# Patient Record
Sex: Male | Born: 2000 | Hispanic: No | Marital: Single
Health system: Southern US, Community
[De-identification: ages and names within clinical notes are randomized; demographics above are authoritative.]

---
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
## 2018-03-23 ENCOUNTER — Encounter: Payer: Self-pay | Admitting: Podiatry

## 2018-03-23 ENCOUNTER — Ambulatory Visit: Payer: BLUE CROSS/BLUE SHIELD | Admitting: Podiatry

## 2018-03-23 ENCOUNTER — Ambulatory Visit (INDEPENDENT_AMBULATORY_CARE_PROVIDER_SITE_OTHER): Payer: BLUE CROSS/BLUE SHIELD

## 2018-03-23 DIAGNOSIS — H5502 Latent nystagmus: Secondary | ICD-10-CM | POA: Insufficient documentation

## 2018-03-23 DIAGNOSIS — H53009 Unspecified amblyopia, unspecified eye: Secondary | ICD-10-CM | POA: Insufficient documentation

## 2018-03-23 DIAGNOSIS — M2011 Hallux valgus (acquired), right foot: Secondary | ICD-10-CM

## 2018-03-23 DIAGNOSIS — H47399 Other disorders of optic disc, unspecified eye: Secondary | ICD-10-CM | POA: Insufficient documentation

## 2018-03-23 DIAGNOSIS — H50111 Monocular exotropia, right eye: Secondary | ICD-10-CM | POA: Insufficient documentation

## 2018-03-23 NOTE — Progress Notes (Signed)
  Subjective:  Patient ID: Alec Perez, male    DOB: 10/08/2001,  MRN: [id number] HPI Chief Complaint  Patient presents with  . 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 New Patient (Initial Visit)    17 y.o. male presents with the above complaint.   ROS: He denies fever chills nausea vomiting muscle aches pains calf pain back pain chest pain shortness of breath.  No past medical history on file.   Current Outpatient Medications:  .  dexmethylphenidate (FOCALIN XR) 15 MG 24 hr capsule, , Disp: , Rfl:  .  EPINEPHrine (EPIPEN JR) 0.15 MG/0.3ML injection, , Disp: , Rfl:   Allergies  Allergen Reactions  . Bee Pollen Anaphylaxis   Review of Systems Objective:  There were no vitals filed for this visit.  General: Well developed, nourished, in no acute distress, alert and oriented x3   Dermatological: Skin is warm, dry and supple bilateral. Nails x 10 are well maintained; remaining integument appears unremarkable at this time. There are no open sores, no preulcerative lesions, no rash or signs of infection present.  Vascular: Dorsalis Pedis artery and Posterior Tibial artery pedal pulses are 2/4 bilateral with immedate capillary fill time. Pedal hair growth present. No varicosities and no lower extremity edema present bilateral.   Neruologic: Grossly intact via light touch bilateral. Vibratory intact via tuning fork bilateral. Protective threshold with Semmes Wienstein monofilament intact to all pedal sites bilateral. Patellar and Achilles deep tendon reflexes 2+ bilateral. No Babinski or clonus noted bilateral.   Musculoskeletal: No gross boney pedal deformities bilateral. No pain, crepitus, or limitation noted with foot and ankle range of motion bilateral. Muscular strength 5/5 in all groups tested bilateral.  Gait: Unassisted, Nonantalgic.    Radiographs:  Radiographs of the right foot taken today demonstrate an  osseously mature individual increase in the first intermetatarsal angle resulting in an increase in the hallux abductus angle.  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  Hypertrophic medial condyle is present.  No other osseous abnormalities.  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    Plan: Discussed etiology pathology conservative surgical therapy at this point in time performed a surgical consult consisting of an [facility] bunion repair with screw fixation right foot.  He understands this is amenable to it we did discuss the possible postop complications which may include but are not limited to postop pain bleeding swelling infection recurrence need further surgery overcorrection under correction loss of digit loss of limb loss of life.  He understands this is amenable to it dispensed a Cam walker today signed all 3 pages a consent form dispensed information regarding his preop surgery expectations as well as surgical center as and anesthesia information.     Max T. BristolHyatt, [place] Dakota[place]PM

## 2018-03-23 NOTE — Patient Instructions (Signed)
Pre-Operative Instructions  Congratulations, you have decided to take an important step towards improving your quality of life.  You can be assured that the doctors and staff at [facility] will be with you every step of the way.  Here are some important things you should know:  1. Plan to be at the surgery center/hospital at least 1 (one) hour prior to your scheduled time, unless otherwise directed by the surgical center/hospital staff.  You must have a responsible adult accompany you, remain during the surgery and drive you home.  Make sure you have directions to the surgical center/hospital to ensure you arrive on time. 2. If you are having surgery at [facility] or [facility], you will need a copy of your medical history and physical form from your family physician within one month prior to the date of surgery. We will give you a form for your primary physician to complete.  3. We make every effort to accommodate the date you request for surgery.  However, there are times where surgery dates or times have to be moved.  We will contact you as soon as possible if a change in schedule is required.   4. No aspirin/ibuprofen for one week before surgery.  If you are on aspirin, any non-steroidal anti-inflammatory medications (Mobic, Aleve, Ibuprofen) should not be taken seven (7) days prior to your surgery.  You make take Tylenol for pain prior to surgery.  5. Medications - If you are taking daily heart and blood pressure medications, seizure, reflux, allergy, asthma, anxiety, pain or diabetes medications, make sure you notify the surgery center/hospital before the day of surgery so they can tell you which medications you should take or avoid the day of surgery. 6. No food or drink after midnight the night before surgery unless directed otherwise by surgical center/hospital staff. 7. No alcoholic beverages 24-hours prior to surgery.  No smoking 24-hours prior or 24-hours after  surgery. 8. Wear loose pants or shorts. They should be loose enough to fit over bandages, boots, and casts. 9. Don't wear slip-on shoes. Sneakers are preferred. 10. Bring your boot with you to the surgery center/hospital.  Also bring crutches or a walker if your physician has prescribed it for you.  If you do not have this equipment, it will be provided for you after surgery. 11. If you have not been contacted by the surgery center/hospital by the day before your surgery, call to confirm the date and time of your surgery. 12. Leave-time from work may vary depending on the type of surgery you have.  Appropriate arrangements should be made prior to surgery with your employer. 13. Prescriptions will be provided immediately following surgery by your doctor.  Fill these as soon as possible after surgery and take the medication as directed. Pain medications will not be refilled on weekends and must be approved by the doctor. 14. Remove nail polish on the operative foot and avoid getting pedicures prior to surgery. 15. Wash the night before surgery.  The night before surgery wash the foot and leg well with water and the antibacterial soap provided. Be sure to pay special attention to beneath the toenails and in between the toes.  Wash for at least three (3) minutes. Rinse thoroughly with water and dry well with a towel.  Perform this wash unless told not to do so by your physician.  Enclosed: 1 Ice pack (please put in freezer the night before surgery)   1 Hibiclens skin cleaner     Pre-op instructions  If you have any questions regarding the instructions, please do not hesitate to call our office.  [place]: [address], [place], [place] [address] -- [phone]  [place]: [address] [place], [place] [address] -- [phone]  [place]: [address]  [place], [place] [address] -- [phone]  [place]: [address], [address], [place], [place] [address] -- [phone]  Website:  [email or web address] 

## 2018-05-05 ENCOUNTER — Other Ambulatory Visit: Payer: Self-pay | Admitting: Podiatry

## 2018-05-05 ENCOUNTER — Telehealth: Payer: Self-pay | Admitting: *Deleted

## 2018-05-05 MED ORDER — ONDANSETRON HCL 4 MG PO TABS: 4.0000 mg | ORAL_TABLET | Freq: Three times a day (TID) | ORAL | 0 refills | Status: DC | PRN

## 2018-05-05 MED ORDER — CEPHALEXIN 500 MG PO CAPS
500.0000 mg | ORAL_CAPSULE | Freq: Three times a day (TID) | ORAL | 0 refills | Status: DC
Start: 2018-05-05 — End: 2018-05-23

## 2018-05-05 MED ORDER — OXYCODONE-ACETAMINOPHEN 10-325 MG PO TABS: 1.0000 | ORAL_TABLET | Freq: Three times a day (TID) | ORAL | 0 refills | Status: DC | PRN

## 2018-05-05 NOTE — Telephone Encounter (Signed)
"  Mrs. Caralee Atesndrews is calling saying her son is supposed to be scheduled for surgery on tomorrow.  Do you have him scheduled, Aram BeechamCynthia doesn't have it in the computer?"  I do not have him scheduled.  "So what do you want me to tell her?"  Let me check with Dr. Al CorpusHyatt and see if we can add him on for tomorrow.  I am calling to let you know that Dr. Al CorpusHyatt said to go ahead and schedule him for the surgery.  "Okay, we'll take care of it."

## 2018-05-06 ENCOUNTER — Encounter: Payer: Self-pay | Admitting: Podiatry

## 2018-05-06 DIAGNOSIS — M2011 Hallux valgus (acquired), right foot: Secondary | ICD-10-CM

## 2018-05-09 ENCOUNTER — Telehealth: Payer: Self-pay | Admitting: Podiatry

## 2018-05-09 ENCOUNTER — Ambulatory Visit (INDEPENDENT_AMBULATORY_CARE_PROVIDER_SITE_OTHER): Payer: BLUE CROSS/BLUE SHIELD | Admitting: Podiatry

## 2018-05-09 ENCOUNTER — Encounter: Payer: Self-pay | Admitting: Podiatry

## 2018-05-09 ENCOUNTER — Emergency Department: Payer: BLUE CROSS/BLUE SHIELD

## 2018-05-09 ENCOUNTER — Ambulatory Visit (INDEPENDENT_AMBULATORY_CARE_PROVIDER_SITE_OTHER): Payer: BLUE CROSS/BLUE SHIELD

## 2018-05-09 ENCOUNTER — Emergency Department
Admission: EM | Admit: 2018-05-09 | Discharge: 2018-05-09 | Disposition: A | Discharge status: Home / Self Care | Payer: BLUE CROSS/BLUE SHIELD | Attending: Emergency Medicine | Admitting: Emergency Medicine

## 2018-05-09 ENCOUNTER — Other Ambulatory Visit: Payer: Self-pay

## 2018-05-09 ENCOUNTER — Encounter: Payer: Self-pay | Admitting: *Deleted

## 2018-05-09 VITALS — BP 96/50 | HR 77 | Temp 99.2°F

## 2018-05-09 DIAGNOSIS — R509 Fever, unspecified: Secondary | ICD-10-CM | POA: Diagnosis not present

## 2018-05-09 DIAGNOSIS — R5383 Other fatigue: Secondary | ICD-10-CM | POA: Insufficient documentation

## 2018-05-09 DIAGNOSIS — R531 Weakness: Secondary | ICD-10-CM | POA: Diagnosis not present

## 2018-05-09 DIAGNOSIS — G8918 Other acute postprocedural pain: Secondary | ICD-10-CM | POA: Diagnosis not present

## 2018-05-09 DIAGNOSIS — M2011 Hallux valgus (acquired), right foot: Secondary | ICD-10-CM | POA: Diagnosis not present

## 2018-05-09 DIAGNOSIS — R000 Tachycardia, unspecified: Secondary | ICD-10-CM | POA: Insufficient documentation

## 2018-05-09 DIAGNOSIS — R51 Headache: Secondary | ICD-10-CM | POA: Diagnosis not present

## 2018-05-09 DIAGNOSIS — R112 Nausea with vomiting, unspecified: Secondary | ICD-10-CM | POA: Diagnosis not present

## 2018-05-09 DIAGNOSIS — Z9889 Other specified postprocedural states: Secondary | ICD-10-CM

## 2018-05-09 LAB — CBC WITH DIFFERENTIAL/PLATELET
Basophils Absolute: 0 10*3/uL (ref 0–0.1)
Basophils Relative: 0 %
Eosinophils Absolute: 0 10*3/uL (ref 0–0.7)
Eosinophils Relative: 0 %
HCT: 39.4 % — ABNORMAL LOW (ref 40.0–52.0)
Hemoglobin: 14.5 g/dL (ref 13.0–18.0)
Lymphocytes Relative: 5 %
Lymphs Abs: 0.5 10*3/uL — ABNORMAL LOW (ref 1.0–3.6)
MCH: 32.6 pg (ref 26.0–34.0)
MCHC: 36.8 g/dL — ABNORMAL HIGH (ref 32.0–36.0)
MCV: 88.5 fL (ref 80.0–100.0)
Monocytes Absolute: 1.1 10*3/uL — ABNORMAL HIGH (ref 0.2–1.0)
Monocytes Relative: 12 %
Neutro Abs: 7.4 10*3/uL — ABNORMAL HIGH (ref 1.4–6.5)
Neutrophils Relative %: 83 %
Platelets: 184 10*3/uL (ref 150–440)
RBC: 4.46 MIL/uL (ref 4.40–5.90)
RDW: 12.3 % (ref 11.5–14.5)
WBC: 9.1 10*3/uL (ref 3.8–10.6)

## 2018-05-09 LAB — URINALYSIS, COMPLETE (UACMP) WITH MICROSCOPIC
Bacteria, UA: NONE SEEN
Bilirubin Urine: NEGATIVE
Glucose, UA: NEGATIVE mg/dL
Hgb urine dipstick: NEGATIVE
Ketones, ur: NEGATIVE mg/dL
Leukocytes, UA: NEGATIVE
Nitrite: NEGATIVE
Protein, ur: NEGATIVE mg/dL
Specific Gravity, Urine: 1.01 (ref 1.005–1.030)
Squamous Epithelial / LPF: NONE SEEN (ref 0–5)
pH: 8 (ref 5.0–8.0)

## 2018-05-09 LAB — COMPREHENSIVE METABOLIC PANEL
ALT: 129 U/L — ABNORMAL HIGH (ref 0–44)
AST: 85 U/L — ABNORMAL HIGH (ref 15–41)
Albumin: 4.6 g/dL (ref 3.5–5.0)
Alkaline Phosphatase: 139 U/L — ABNORMAL HIGH (ref 38–126)
Anion gap: 12 (ref 5–15)
BUN: 11 mg/dL (ref 6–20)
CO2: 26 mmol/L (ref 22–32)
Calcium: 9.6 mg/dL (ref 8.9–10.3)
Chloride: 100 mmol/L (ref 98–111)
Creatinine, Ser: 0.93 mg/dL (ref 0.61–1.24)
GFR calc Af Amer: >60 mL/min (ref >60)
GFR calc non Af Amer: >60 mL/min (ref >60)
Glucose, Bld: 84 mg/dL (ref 70–99)
Potassium: 3.5 mmol/L (ref 3.5–5.1)
Sodium: 138 mmol/L (ref 135–145)
Total Bilirubin: 1 mg/dL (ref 0.3–1.2)
Total Protein: 7.7 g/dL (ref 6.5–8.1)

## 2018-05-09 LAB — MONONUCLEOSIS SCREEN: Mono Screen: NEGATIVE

## 2018-05-09 LAB — LACTIC ACID, PLASMA
Lactic Acid, Venous: 2.5 mmol/L (ref 0.5–1.9)
Lactic Acid, Venous: 0.9 mmol/L (ref 0.5–1.9)

## 2018-05-09 MED ORDER — HYDROCODONE-ACETAMINOPHEN 10-325 MG PO TABS: 1.0000 | ORAL_TABLET | Freq: Three times a day (TID) | ORAL | 0 refills | Status: DC | PRN

## 2018-05-09 MED ORDER — PIPERACILLIN-TAZOBACTAM 3.375 G IVPB 30 MIN
3.3750 g | Freq: Once | INTRAVENOUS | Status: AC
  Administered 2018-05-09: 3.375 g via INTRAVENOUS
  Filled 2018-05-09: qty 50

## 2018-05-09 MED ORDER — KETOROLAC TROMETHAMINE 30 MG/ML IJ SOLN
30.0000 mg | Freq: Once | INTRAMUSCULAR | Status: AC
  Administered 2018-05-09: 30 mg via INTRAVENOUS

## 2018-05-09 MED ORDER — KETOROLAC TROMETHAMINE 30 MG/ML IJ SOLN
INTRAMUSCULAR | Status: AC
  Administered 2018-05-09: 30 mg via INTRAVENOUS
  Filled 2018-05-09: qty 1

## 2018-05-09 MED ORDER — SODIUM CHLORIDE 0.9 % IV BOLUS
30.0000 mL/kg | Freq: Once | INTRAVENOUS | Status: AC
  Administered 2018-05-09: 1942 mL via INTRAVENOUS

## 2018-05-09 NOTE — ED Notes (Signed)

## 2018-05-09 NOTE — Telephone Encounter (Signed)
Pt's mtr, Alec Perez states she knows the difference between seeping and bleeding and it is bleeding. Alec Perez states pt was stubborn and walked on the foot because he had the leg block and it is still bleeding, even through the extra padding. I told Alec Perez, pt needed to be seen and transferred to schedulers to get an appt in [place] today.

## 2018-05-09 NOTE — Telephone Encounter (Signed)
Patient was seen in office today.  

## 2018-05-09 NOTE — Telephone Encounter (Signed)
My son Alec Perez had surgery with Dr. Al CorpusHyatt on Friday. He is bleeding through his bandages. He was bleeding on Saturday and I called Dr. Charlsie Merlesegal and he said to put more bandages on there. It is not seeping, it was bleeding from front to back. So just give me a call back to let me know if I need to bring him in to see Dr. Al CorpusHyatt or if he's okay. My number is [phone]. Thank you.

## 2018-05-09 NOTE — ED Provider Notes (Signed)
[facility] Emergency Department Provider Note __________________________________________   None    (approximate)  I have reviewed the triage vital signs and the nursing notes.   HISTORY  Chief Complaint Post-op Problem and Fever  HPI Alec Perez is a 17 y.o. male who presents to the emergency department for treatment and evaluation of fever, chills, nausea, vomiting without diarrhea.  Mother states that he had foot surgery on Friday and discharged home.  He has had intermittent fevers up to 100 per his mother, but today she states that his fever was 103.  She took him to his follow-up appointment where the foot was redressed and mom states that his "blood pressure was really low."  He has had several episodes of vomiting today.  Mom states that she has been dispensing his pain medications and his last dose was at 1 PM.   History reviewed. No pertinent past medical history.  Patient Active Problem List   Diagnosis Date Noted  . Amblyopia 03/23/2018  . 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 History of strabismus surgery 06/23/2017  . 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 Authorizing Provider  cephALEXin (KEFLEX) 500 MG capsule Take 1 capsule (500 mg total) by mouth 3 (three) times daily. 05/05/18  Yes Hyatt, Max T, DPM  HYDROcodone-acetaminophen (NORCO) 10-325 MG tablet Take 1 tablet by mouth every 8 (eight) hours as needed. 05/09/18   Hyatt, Max T, DPM  ondansetron (ZOFRAN) 4 MG tablet Take 1 tablet (4 mg total) by mouth every 8 (eight) hours as needed for nausea or vomiting. 05/05/18   Hyatt, Max T, DPM    Allergies Bee pollen and Percocet [oxycodone-acetaminophen]  History reviewed. No pertinent family history.  Social History Social History   Tobacco Use  . Smoking status: Unknown If Ever  Smoked  . Smokeless tobacco: Never Used  Substance Use Topics  . Alcohol use: Not on file  . Drug use: Not on file    Review of Systems  Constitutional: Positive for fever/chills Eyes: No visual changes. ENT: No sore throat. Cardiovascular: Denies chest pain. Respiratory: Denies shortness of breath. Gastrointestinal: No abdominal pain.  Positive for nausea and vomiting.  No diarrhea. Genitourinary: Negative for dysuria. Musculoskeletal: Negative for back pain. Skin: Negative for rash. Neurological: Positive for headaches, focal weakness or numbness. ___________________________________________   PHYSICAL EXAM:  VITAL SIGNS: ED Triage Vitals  Enc Vitals Group     BP 05/09/18 1911 (!) 154/84     Pulse Rate 05/09/18 1911 (!) 103     Resp 05/09/18 1911 18     Temp --      Temp src --      SpO2 05/09/18 1911 100 %     Weight 05/09/18 1912 145 lb (65.8 kg)     Height 05/09/18 1912 6\' 1"  (1.854 m)     Head Circumference --      Peak Flow --      Pain Score 05/09/18 1912 7     Pain Loc --      Pain Edu? --      Excl. in GC? --     Constitutional: Lethargic but easily awakens to voice. Oriented upon arousal. Ill appearing and in no acute respiratory distress. Eyes: Conjunctivae are normal. PERRL. EOMI. Exotropia of the right eye. Head: Atraumatic. Nose: No  congestion/rhinnorhea. Mouth/Throat: Mucous membranes are moist.  Oropharynx non-erythematous. Neck: No stridor.   Cardiovascular: Tachycardic, regular rhythm. Grossly normal heart sounds.  Good peripheral circulation. Respiratory: Normal respiratory effort.  No retractions. Lungs CTAB. Gastrointestinal: Soft and nontender. No distention. No abdominal bruits. No CVA tenderness. Musculoskeletal: Right foot postsurgical without concern of infection. Wound edges well approximated. Scant blood noted from the incision site after removing dressing. Neurologic:  GCS 14.  Skin:  Skin is warm, dry and intact with exception of the  surgical wound. Psychiatric: Cooperative.  ____________________________________________   LABS (all labs ordered are listed, but only abnormal results are displayed)  Labs Reviewed  LACTIC ACID, PLASMA - Abnormal; Notable for the following components:      Result Value   Lactic Acid, Venous 2.5 (*)    All other components within normal limits  COMPREHENSIVE METABOLIC PANEL - Abnormal; Notable for the following components:   AST 85 (*)    ALT 129 (*)    Alkaline Phosphatase 139 (*)    All other components within normal limits  CBC WITH DIFFERENTIAL/PLATELET - Abnormal; Notable for the following components:   HCT 39.4 (*)    MCHC 36.8 (*)    Neutro Abs 7.4 (*)    Lymphs Abs 0.5 (*)    Monocytes Absolute 1.1 (*)    All other components within normal limits  URINALYSIS, COMPLETE (UACMP) WITH MICROSCOPIC - Abnormal; Notable for the following components:   Color, Urine YELLOW (*)    APPearance CLEAR (*)    All other components within normal limits  CULTURE, BLOOD (ROUTINE X 2)  CULTURE, BLOOD (ROUTINE X 2)  LACTIC ACID, PLASMA  MONONUCLEOSIS SCREEN   ____________________________________________  EKG  ED ECG REPORT I, German Manke,FNP-BC, personally viewed and interpreted this ECG.   Date: 05/09/2018  EKG Time: 19:10  Rate: 100  Rhythm: normal EKG, normal sinus rhythm, unchanged from previous tracings, sinus tachycardia  Axis: No deviation  Intervals:none  ST&T Change: none  ____________________________________________  RADIOLOGY  ED MD interpretation:  No acute concerns on chest image.  Official radiology report(s): Dg Chest 2 View  Result Date: 05/09/2018 CLINICAL DATA:  Fever EXAM: CHEST - 2 VIEW COMPARISON:  None. FINDINGS: Heart and mediastinal contours are within normal limits. No focal opacities or effusions. No acute bony abnormality. IMPRESSION: No active cardiopulmonary disease. Electronically Signed   By: Charlett Nose M.D.   On: 05/09/2018 19:47   Dg  Foot Complete Right  Result Date: 05/09/2018 Please see detailed radiograph report in office note.   ____________________________________________   PROCEDURES  Procedure(s) performed: None  Procedures  Critical Care performed: Yes, see critical care note(s)  ____________________________________________   INITIAL IMPRESSION / ASSESSMENT AND PLAN / ED COURSE  As part of my medical decision making, I reviewed the following data within the electronic MEDICAL RECORD NUMBER History obtained from family  17 year old male presenting to the emergency department for treatment and evaluation of fever, tachycardia, and vomiting.  He had a foot surgery 2 days ago, but this site does not appear as a source of infection.  Patient does meet sepsis criteria and will be treated as such, however this may also be a viral illness. ----------------------------------------- 8:13 PM on 05/09/2018 -----------------------------------------  Patient sitting upright conversing with parents.  He relates his current symptoms to taking the Percocet.  Mother states that Dr. Al Corpus switched him from [facility] to [facility], but they have not yet picked that prescription up.  ----------------------------------------- 10:56 PM on 05/09/2018 -----------------------------------------  After receiving fluids, patients symptoms completely resolved.  He did receive Toradol while here which completely resolved his headache as well as the pain in the postsurgical foot.  A repeat lactic acid showed resolution.  Case was discussed with my attending Dr. Marisa SeverinSiadecki who agrees that the patient is stable for discharge.  Strict ER return precautions were given to the patient as well as his mother.  He is to schedule a follow-up appointment with his primary care provider within the next couple of days or sooner for concerns.  Prior to discharge, all of his daily medications were reviewed including his Zofran, Keflex, and Norco.  Clinical  Course as of May 10 2343  Mon May 09, 2018  2304 EKG 12-Lead [CT]    Clinical Course User Index [CT] Kem Boroughsriplett, Cataldo Cosgriff B, FNP   CRITICAL CARE Performed by: Kem Boroughsari Toben Acuna   Total critical care time: 30 minutes  Critical care time was exclusive of separately billable procedures and treating other patients.  Critical care was necessary to treat or prevent imminent or life-threatening deterioration.  Critical care was time spent personally by me on the following activities: development of treatment plan with patient and/or surrogate as well as nursing, discussions with consultants, evaluation of patient's response to treatment, examination of patient, obtaining history from patient or surrogate, ordering and performing treatments and interventions, ordering and review of laboratory studies, ordering and review of radiographic studies, pulse oximetry and re-evaluation of patient's condition.   ____________________________________________   FINAL CLINICAL IMPRESSION(S) / ED DIAGNOSES  Final diagnoses:  Febrile illness, acute     ED Discharge Orders    None       Note:  This document was prepared using Dragon voice recognition software and may include unintentional dictation errors.    Chinita Pesterriplett, Florentina Marquart B, FNP 05/09/18 2344    Dionne BucySiadecki, Sebastian, MD 05/12/18 0003

## 2018-05-09 NOTE — Progress Notes (Addendum)
CODE SEPSIS - PHARMACY COMMUNICATION  **Broad Spectrum Antibiotics should be administered within 1 hour of Sepsis diagnosis**  Time Code Sepsis Called/Page Received: 19:17  Antibiotics Ordered: None at present  Additional action taken by pharmacy: Code pager handed off to next pharmacist for follow up after hours  If necessary, Name of Provider/Nurse Contacted: 19:47 -- Albina Billetontacted Rachel in ED regarding no antibiotics ordered yet.    Stormy CardKatsoudas,Nyema Hachey K, [facility] At [facility] BiltmoreRPH Clinical Pharmacist  05/09/2018  7:47 PM    Addendum:   Antibiotics Ordered: Zosyn  Time of 1st antibiotic administration:  20:00

## 2018-05-09 NOTE — ED Provider Notes (Signed)
-----------------------------------------   11:35 PM on 05/09/2018 -----------------------------------------  I saw this patient with the FNP.  Patient presented with fever over the course the day today associated generalized weakness, and possible altered mental status although patient was primarily just overall weak and somewhat lethargic.  He is 4 days postop foot surgery.  On exam, the patient was febrile and slightly tachycardic but other vital signs were normal.  The foot appeared to be healing normally and there was no erythema, purulent drainage, or other signs of acute infection.  The remainder of his exam was unremarkable.  Neuro exam was nonfocal, and the patient was responding appropriately to my questions.  Overall my primary suspicion was for viral syndrome.  Given the patient's vital signs and concern for possible AMS we obtained sepsis work-up.  Patient was given fluids and symptomatic medication for fever.  The initial lactate was minimally elevated but repeat after fluids was normal.  The other labs were reassuring.  Patient's vital signs improved after fluids.  On reassessment, the patient appeared comfortable, and stated he felt much better.  He was fully alert and continued to behave appropriately.  He had no meningeal signs or photophobia.  He felt well to go home.  Patient was stable for discharge with presumed diagnosis of viral infection.  Return precautions were given.   Dionne BucySiadecki, Vanda Waskey, MD 05/09/18 [phone]

## 2018-05-09 NOTE — Discharge Instructions (Signed)
Please stay well hydrated. Schedule a follow up appointment with primary care. Return to the ER for symptoms of concern if unable to see primary care or Dr. Al CorpusHyatt.

## 2018-05-09 NOTE — Progress Notes (Signed)
He presents today for his first postop visit he is status post Eliberto Ivoryustin bunionectomy of the right foot date of surgery 05/06/2018 states that he is having a lot of pain in the wound on Saturday and particularly noticed a lot of bleeding.  His mother states that he had been out a long time on Friday while his foot was still numb doing a lot of walking.  Had a fever of 101 this morning with nausea and vomiting is currently taking antinausea medication.  Objective: Vital signs are stable alert and oriented x3.  Pulses are palpable.  Neurologic sensorium is intact.  Dry sterile dressing intact was removed demonstrates moderate blood but is dry it does not appear to be actively bleeding.  Sutures are intact margins well coapted radiographs demonstrate a good osteotomy in good position of the foot.  Assessment: Well-healing surgical foot moderate edema some pain.  Plan: Redressed today dressed a compressive dressing written prescription for Vicodin.  Follow-up with him in 2 weeks

## 2018-05-09 NOTE — ED Triage Notes (Signed)
Pt presents x 4 days post op from foot surgery w/ reports of fever intermittently at home, 103 degrees PTA. Pulses strong through banage. Pt's bandage was changed today at surgeon's office. Pt last had pain meds at 1300 today. Pt has AMS, is pale.

## 2018-05-09 NOTE — ED Provider Notes (Signed)
-----------------------------------------   7:46 PM on 05/09/2018 -----------------------------------------  ED ECG REPORT I, Dionne BucySebastian Ercell Razon, the attending physician, personally viewed and interpreted this ECG.  Date: 05/09/2018 EKG Time: 1910 Rate: 100 Rhythm: normal sinus rhythm QRS Axis: normal Intervals: normal ST/T Wave abnormalities: normal Narrative Interpretation: no evidence of acute ischemia    Dionne BucySiadecki, Eberardo Demello, MD 05/09/18 1946

## 2018-05-09 NOTE — Progress Notes (Signed)
CODE SEPSIS - PHARMACY COMMUNICATION  **Broad Spectrum Antibiotics should be administered within 1 hour of Sepsis diagnosis**  Time Code Sepsis Called/Page Received: 19:19  Antibiotics Ordered: None at present  Time of 1st antibiotic administration:   Additional action taken by pharmacy: Code pager handed off to next pharmacist for follow up after hours  If necessary, Name of Provider/Nurse Contacted:     Carola FrostNathan A Ernan Runkles ,PharmD Clinical Pharmacist  05/09/2018  7:19 PM

## 2018-05-11 ENCOUNTER — Encounter: Payer: Self-pay | Admitting: Podiatry

## 2018-05-11 ENCOUNTER — Encounter: Payer: BLUE CROSS/BLUE SHIELD | Admitting: Podiatry

## 2018-05-14 LAB — CULTURE, BLOOD (ROUTINE X 2)
Culture: NO GROWTH
Culture: NO GROWTH
Special Requests: ADEQUATE
Special Requests: ADEQUATE

## 2018-05-23 ENCOUNTER — Ambulatory Visit (INDEPENDENT_AMBULATORY_CARE_PROVIDER_SITE_OTHER): Payer: BLUE CROSS/BLUE SHIELD | Admitting: Podiatry

## 2018-05-23 ENCOUNTER — Encounter: Payer: Self-pay | Admitting: Podiatry

## 2018-05-23 DIAGNOSIS — Z9889 Other specified postprocedural states: Secondary | ICD-10-CM

## 2018-05-23 DIAGNOSIS — M2011 Hallux valgus (acquired), right foot: Secondary | ICD-10-CM

## 2018-05-23 NOTE — Progress Notes (Signed)
He presents today for his second postop visit states that he is doing much better he is very happy with the outcome.  Objective: Vital signs are stable he is alert and oriented x3.  Pulses are palpable.  He has good range of motion plantar flexion dorsiflexion is slightly limited and mildly tender on palpation.  Sutures are intact margins well coapted removed the suture ends today.  Assessment: Well-healing surgical foot right.  Plan: I sent a compression anklet and a Darco shoe today and encouraged him to continue to walk only with the Darco shoe and I will follow-up with him in 2 weeks for another set of x-rays at which time we hope to get into our tennis shoe.

## 2018-06-08 ENCOUNTER — Ambulatory Visit (INDEPENDENT_AMBULATORY_CARE_PROVIDER_SITE_OTHER): Payer: BLUE CROSS/BLUE SHIELD | Admitting: Podiatry

## 2018-06-08 ENCOUNTER — Ambulatory Visit (INDEPENDENT_AMBULATORY_CARE_PROVIDER_SITE_OTHER): Payer: BLUE CROSS/BLUE SHIELD

## 2018-06-08 ENCOUNTER — Encounter: Payer: Self-pay | Admitting: Podiatry

## 2018-06-08 DIAGNOSIS — M2011 Hallux valgus (acquired), right foot: Secondary | ICD-10-CM

## 2018-06-08 DIAGNOSIS — Z9889 Other specified postprocedural states: Secondary | ICD-10-CM

## 2018-06-08 NOTE — Progress Notes (Signed)
He presents today date of surgery May 06, 2018 status post Alec Perez bunionectomy right states that is doing good gets a little sore by the end of the day but most the time is doing fine.  Denies any trauma to the foot.  Objective: Vital signs stable alert and oriented x3 there is no erythema edema cellulitis drainage or odor he appears to be healing very nicely incision site is gone on to heal uneventfully.  Has good range of motion on dorsiflexion plantarflexion slightly limited.  Mildly tender on forced plantar flexion.  Radiographs taken today demonstrate well healing osteotomy screws are intact.  Osteotomy site appears to be approximately 60% healed.  Assessment: Well-healing surgical foot.  Plan: Follow-up with me on an as-needed basis or in 6 weeks.

## 2018-07-14 ENCOUNTER — Encounter: Payer: Self-pay | Admitting: Emergency Medicine

## 2018-07-14 ENCOUNTER — Other Ambulatory Visit: Payer: Self-pay

## 2018-07-14 ENCOUNTER — Emergency Department
Admission: EM | Admit: 2018-07-14 | Discharge: 2018-07-14 | Disposition: A | Discharge status: Home / Self Care | Payer: BLUE CROSS/BLUE SHIELD | Attending: Emergency Medicine | Admitting: Emergency Medicine

## 2018-07-14 ENCOUNTER — Emergency Department: Payer: BLUE CROSS/BLUE SHIELD

## 2018-07-14 DIAGNOSIS — S0990XA Unspecified injury of head, initial encounter: Secondary | ICD-10-CM | POA: Diagnosis present

## 2018-07-14 DIAGNOSIS — Z77098 Contact with and (suspected) exposure to other hazardous, chiefly nonmedicinal, chemicals: Secondary | ICD-10-CM

## 2018-07-14 DIAGNOSIS — Y33XXXA Other specified events, undetermined intent, initial encounter: Secondary | ICD-10-CM | POA: Insufficient documentation

## 2018-07-14 DIAGNOSIS — Y929 Unspecified place or not applicable: Secondary | ICD-10-CM | POA: Insufficient documentation

## 2018-07-14 DIAGNOSIS — Y9389 Activity, other specified: Secondary | ICD-10-CM | POA: Insufficient documentation

## 2018-07-14 DIAGNOSIS — Y998 Other external cause status: Secondary | ICD-10-CM | POA: Insufficient documentation

## 2018-07-14 MED ORDER — IBUPROFEN 800 MG PO TABS: 800.0000 mg | ORAL_TABLET | Freq: Three times a day (TID) | ORAL | 0 refills | Status: AC | PRN

## 2018-07-14 MED ORDER — ONDANSETRON 4 MG PO TBDP: 4.0000 mg | ORAL_TABLET | Freq: Three times a day (TID) | ORAL | 0 refills | Status: DC | PRN

## 2018-07-14 NOTE — ED Triage Notes (Addendum)
Patient reports Tuesday getting hit in the side of head with a hose and getting freon and also getting knocked down by dog and hitting head later that night. Patient denies LOC but states the farther away from incident he gets the harder time he has remembering incedent and surrounding time frame. Patient also reports increased fatigue and states "I'm just not me." Patient alert and oriented in triage.

## 2018-07-14 NOTE — ED Provider Notes (Signed)
[facility] Emergency Department Provider Note ____________________________________________  Time seen: 1557  I have reviewed the triage vital signs and the nursing notes.  HISTORY  Chief Complaint  Head Injury  HPI Alec Perez is a 17 y.o. male who presents to the ED accompanied by his mother, for evaluation of intermittent headache, nausea, vomiting, and disorientation.  Patient describes 2 separate head injuries that occurred 2 days prior.  The initial injury occurred while he was working on a truck, changing hoses on the [facility] unit.  He describes the hoses separate from the unit splashing Freon (R 32) into his face and chest.  Patient also describes that the dryer unit on the system hit him in the face momentarily.  He fell backwards, but denies any loss of consciousness.  He immediately flushed the Freon from his face and eyes, but does note exposure to his mouth and believes he may have ingested a small amount.  He denies any cuts, scrapes, abrasions, skin irritation, or thermal burns related to the Freon exposure.  He describes this morning he had an episode of nausea vomiting at about 5 AM.  Following that he continued to have dry heaves but denies any bloody or bilious vomitus.  He is experience intermittent headaches that have been mild for the most part, he does admit to taking 4 tablets of ibuprofen on Wednesday for his headache relief.  He has had intermittent drowsiness and describes fatigue and low energy at this time.  Is otherwise been awake, coordinated, and cognizant according to his mother.  She describes some slight memory loss to recent events, but denies any other symptoms.  Patient denies any chest pain, shortness of breath, skin changes, distal paresthesias, or syncope.  He was advised by his primary provider to report to the ED for further evaluation of a suspected concussion.  History reviewed. No pertinent past medical history.  Patient Active  Problem List   Diagnosis Date Noted  . Amblyopia 03/23/2018  . 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 History of strabismus surgery 06/23/2017  . 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 No pertinent surgical history.  Prior to Admission medications   Medication Sig Start Date End Date Taking? Authorizing Provider  HYDROcodone-acetaminophen (NORCO) 10-325 MG tablet Take 1 tablet by mouth every 8 (eight) hours as needed. 05/09/18   Hyatt, Max T, DPM  ibuprofen (ADVIL,MOTRIN) 800 MG tablet Take 1 tablet (800 mg total) by mouth every 8 (eight) hours as needed for up to 10 days. 07/14/18 07/24/18  Kamirah Shugrue, Charlesetta Ivory, PA-C  ondansetron (ZOFRAN ODT) 4 MG disintegrating tablet Take 1 tablet (4 mg total) by mouth every 8 (eight) hours as needed. 07/14/18   Rebekha Diveley, Charlesetta Ivory, PA-C  ondansetron (ZOFRAN) 4 MG tablet Take 1 tablet (4 mg total) by mouth every 8 (eight) hours as needed for nausea or vomiting. 05/05/18   Hyatt, Max T, DPM    Allergies Bee pollen and Percocet [oxycodone-acetaminophen]  No family history on file.  Social History Social History   Tobacco Use  . Smoking status: Unknown If Ever Smoked  . Smokeless tobacco: Never Used  Substance Use Topics  . Alcohol use: Not on file  . Drug use: Not on file    Review of Systems  Constitutional: Negative for fever. Eyes: Negative for visual changes. ENT: Negative for sore throat. Cardiovascular: Negative for chest pain.  Respiratory: Negative for shortness of breath. Gastrointestinal: Negative for abdominal pain, vomiting and diarrhea. Genitourinary: Negative for dysuria. Musculoskeletal: Negative for back pain. Skin: Negative for rash. Neurological: Negative for headaches, focal weakness or numbness. ____________________________________________  PHYSICAL EXAM:  VITAL SIGNS: ED Triage Vitals  Enc Vitals Group     BP  07/14/18 1259 (!) 142/76     Pulse Rate 07/14/18 1259 98     Resp 07/14/18 1259 14     Temp 07/14/18 1259 98.2 F (36.8 C)     Temp Source 07/14/18 1259 Oral     SpO2 07/14/18 1259 100 %     Weight 07/14/18 1259 145 lb (65.8 kg)     Height 07/14/18 1259 6\' 2"  (1.88 m)     Head Circumference --      Peak Flow --      Pain Score 07/14/18 1305 0     Pain Loc --      Pain Edu? --      Excl. in GC? --     Constitutional: Alert and oriented. Well appearing and in no distress.  GCS = 15. Head: Normocephalic and atraumatic. Eyes: Conjunctivae are normal. PERRL. Normal extraocular movements Ears: Canals clear. TMs intact bilaterally. Nose: No congestion/rhinorrhea/epistaxis. Mouth/Throat: Mucous membranes are moist.  Uvula is midline and tonsils are flat. Neck: Supple. Normal ROM Cardiovascular: Normal rate, regular rhythm. Normal distal pulses. Respiratory: Normal respiratory effort. No wheezes/rales/rhonchi. Gastrointestinal: Soft and nontender. No distention.  Normoactive bowel sounds noted. Musculoskeletal: Nontender with normal range of motion in all extremities.  Neurologic:  Normal gait without ataxia. Normal speech and language. No gross focal neurologic deficits are appreciated. Skin:  Skin is warm, dry and intact. No rash noted.  No skin burns or irritation. Psychiatric: Mood and affect are normal. Patient exhibits appropriate insight and judgment. ____________________________________________   RADIOLOGY  CT Head/Maxilofacial w/o CM  IMPRESSION: Normal head CT.  No acute fracture or dislocation of the maxillofacial bones.  Mucoperiosteal thickening of bilateral maxillary sinuses, right great than left. ____________________________________________  PROCEDURES  Procedures ____________________________________________  INITIAL IMPRESSION / ASSESSMENT AND PLAN / ED COURSE  Patient with ED evaluation of intermittent headaches as well as episodic nausea and vomiting.   Patient had an exposure to Freon as well as a closed head injury.  He and his mother reassured by the negative head and facial CTs.  His symptoms likely represent a combination of a mild postconcussive syndrome and Freon exposure.  Review of the Freon R32 SDS, reveals symptoms that include headache, nausea, vomiting, disorientation, and narcosis. These overlap the symptoms of a concussion. He has a normal exam, and will be discharged with prescriptions or ibuprofen and Zofran. He will follow-up with his pediatrician or return to the ED for worsening symptoms as discussed. He reports return of his appetite, and is requesting discharge. He and his mother verbalize understanding of discharge instructions.  ____________________________________________  FINAL CLINICAL IMPRESSION(S) / ED DIAGNOSES  Final diagnoses:  Chemical exposure  Minor head injury without loss of consciousness, initial encounter      Lissa Hoard, PA-C 07/14/18 1611    Nita Sickle, MD 07/14/18 2251

## 2018-07-14 NOTE — Discharge Instructions (Signed)
Your exam and CT scans are negative for any acute brain injury or bony fractures. You symptoms likely represent a combination of a minor concussion and the effects of the freon exposure. Continue to monitor and treat any headaches or nausea with the medicine provided. Rest, hydrate, and eat small meal to prevent dehydration and fatigue. Return to the Emergency Department as needed.

## 2018-07-14 NOTE — ED Notes (Signed)
See triage note  Presents with family  States he was working on truck Tuesday  States he had something hit him in the head  States he just doesn't feel right  Thinks it has to do with the freon  No fever or n/v

## 2018-07-20 ENCOUNTER — Encounter: Payer: Self-pay | Admitting: Podiatry

## 2018-07-20 ENCOUNTER — Ambulatory Visit (INDEPENDENT_AMBULATORY_CARE_PROVIDER_SITE_OTHER): Payer: BLUE CROSS/BLUE SHIELD | Admitting: Podiatry

## 2018-07-20 ENCOUNTER — Ambulatory Visit (INDEPENDENT_AMBULATORY_CARE_PROVIDER_SITE_OTHER): Payer: BLUE CROSS/BLUE SHIELD

## 2018-07-20 DIAGNOSIS — M2011 Hallux valgus (acquired), right foot: Secondary | ICD-10-CM

## 2018-07-20 DIAGNOSIS — Z9889 Other specified postprocedural states: Secondary | ICD-10-CM

## 2018-07-20 NOTE — Progress Notes (Signed)
He presents today for his postop visit date of surgery 05/06/2018 [facility] bunionectomy right foot states that is doing good feels a little sore right here as he points to the medial side where the transverse incision of the capsulotomy was.  He states that is still painful with his new boots that have not been broken and but otherwise he seems to do pretty well with it.  Objective: Vital signs are stable he is alert and oriented x3 there is no erythematous mild edema or in the first intermetatarsal space no cellulitis drainage or odor he has good full range of motion of the first metatarsal phalangeal joint.  Radiographs taken today demonstrate an osseously mature individual with the first metatarsal osteotomy and double screw fixation which are still in good position.  Assessment: Well-healing surgical foot right.  Plan: We will allow him to get back to his regular routine follow-up with me with questions or concerns.

## 2019-05-30 IMAGING — CR DG CHEST 2V
4 series · 4 of 4 positions shown · non-contrast
Comparison: None.

CLINICAL DATA: Fever

EXAM:
CHEST - 2 VIEW

[chest lat (1 of 2)]
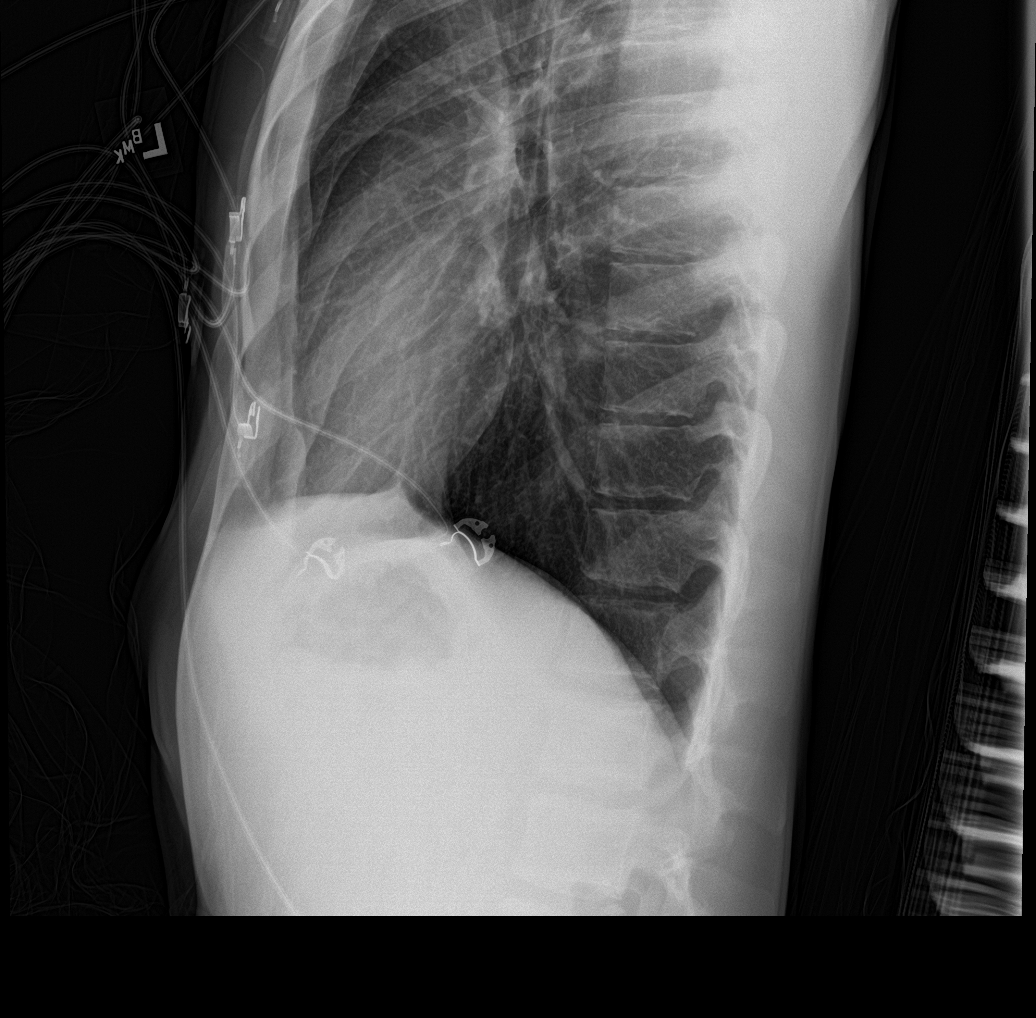

[chest ap (1 of 2)]
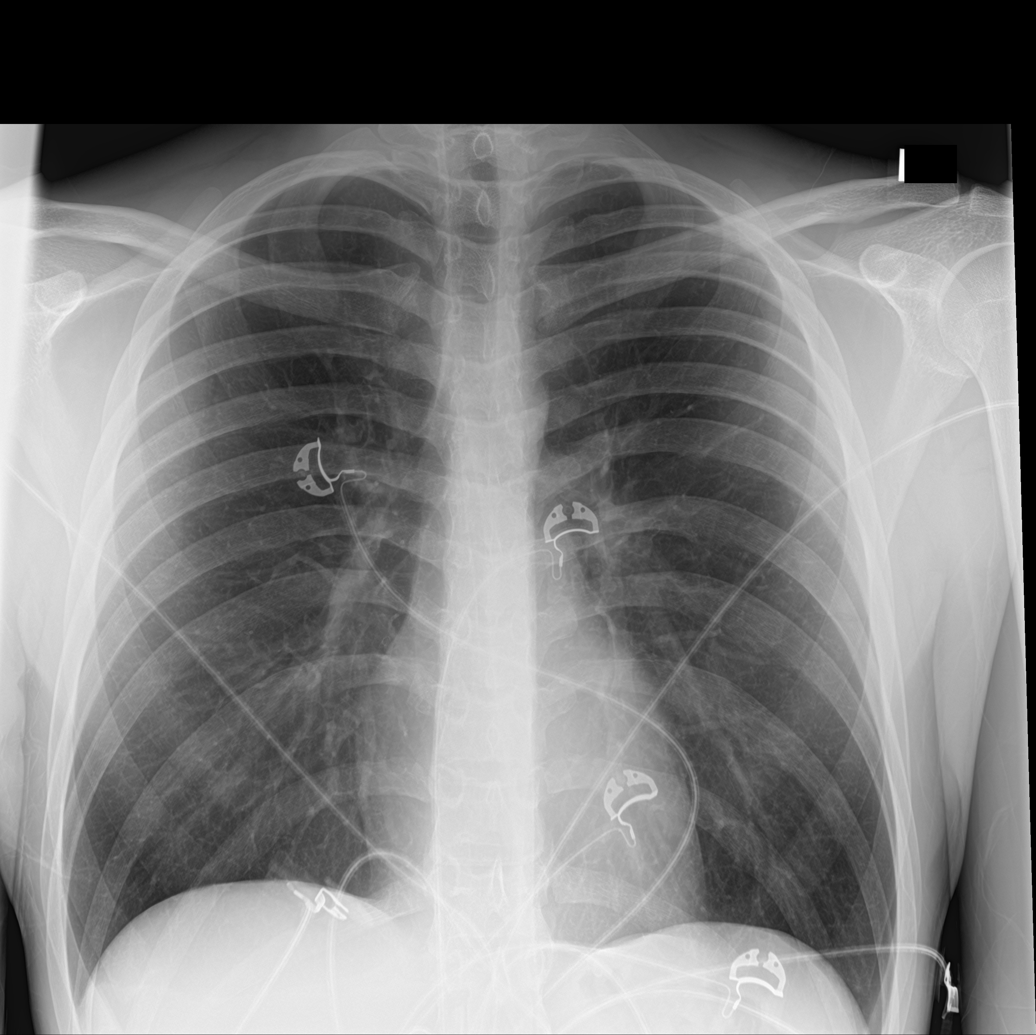

[chest lat (2 of 2)]
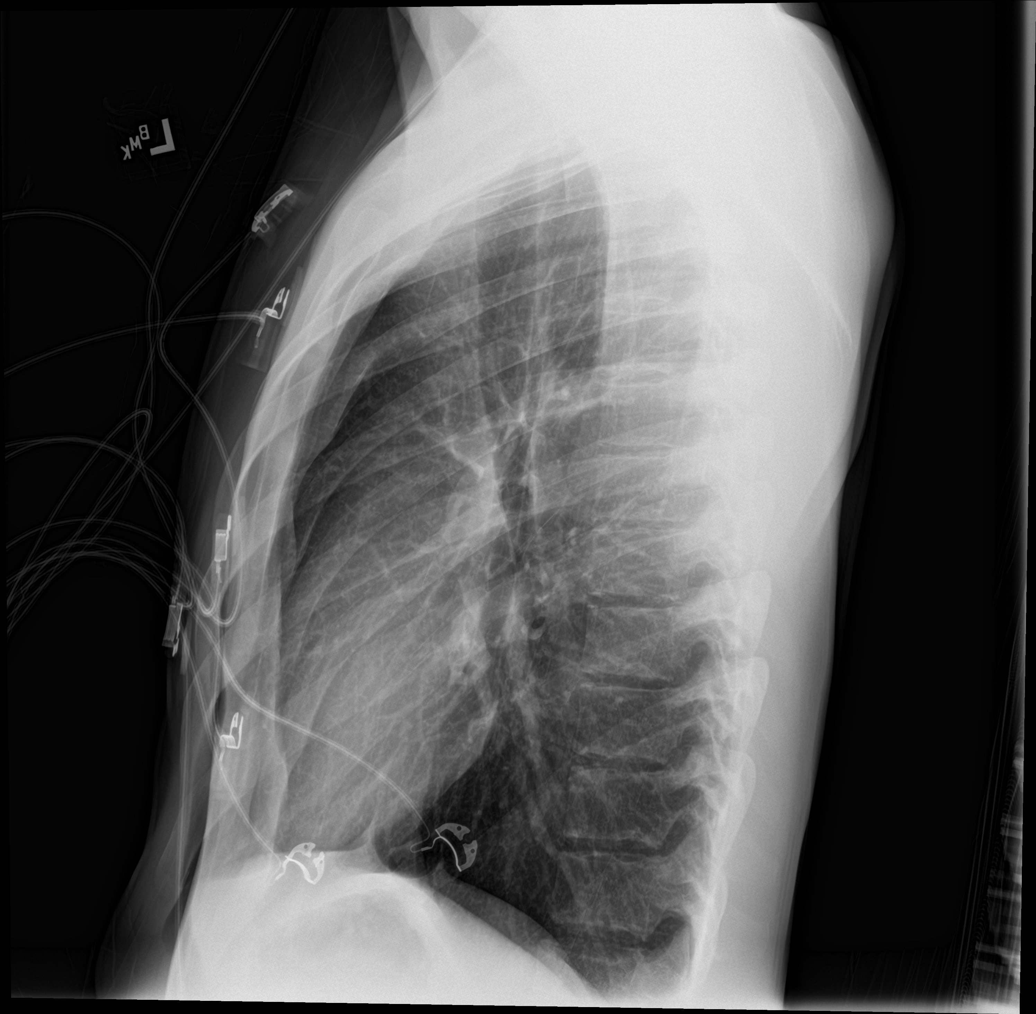

[chest ap (2 of 2)]
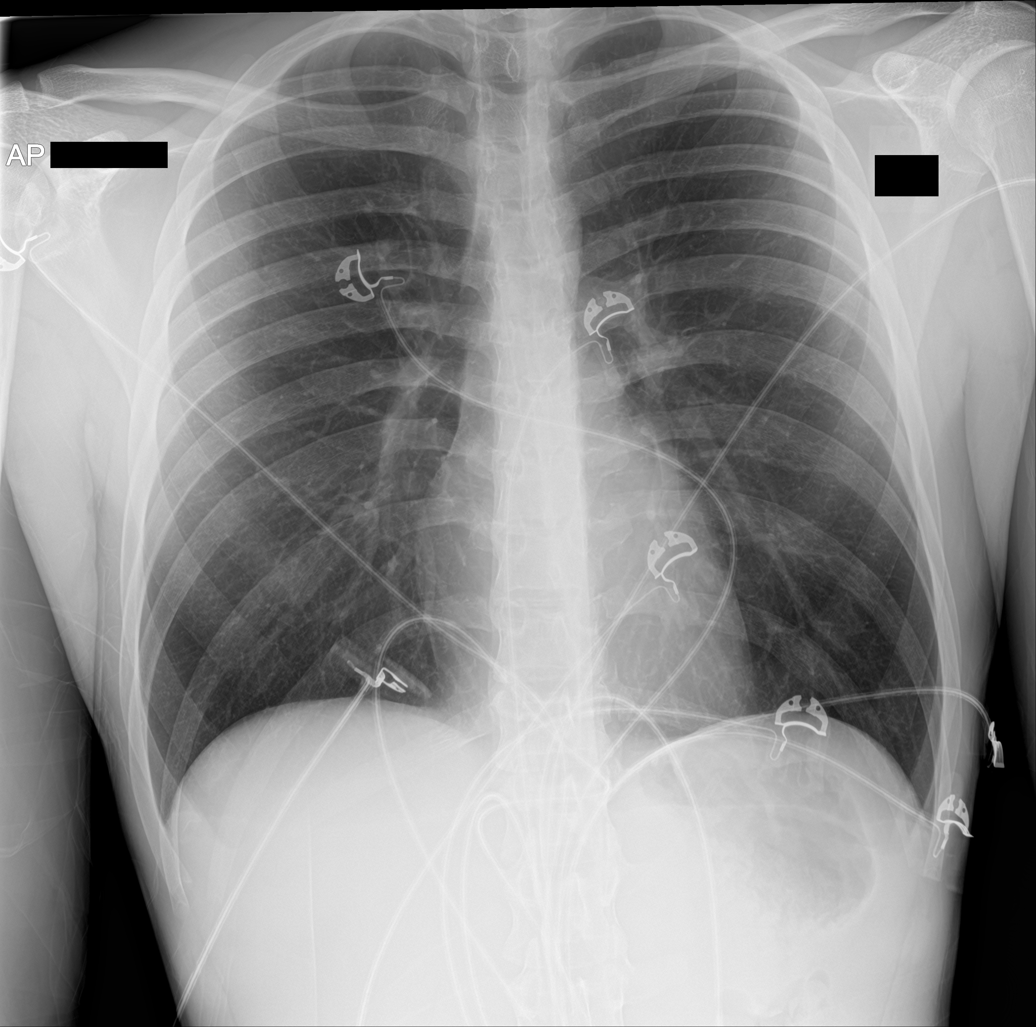

[4 of 4 positions shown; findings below may reference images not displayed]

FINDINGS: Heart and mediastinal contours are within normal limits. No focal
opacities or effusions. No acute bony abnormality.
IMPRESSION: No active cardiopulmonary disease.

## 2019-06-12 ENCOUNTER — Other Ambulatory Visit (HOSPITAL_COMMUNITY): Payer: Self-pay | Admitting: Gastroenterology

## 2019-06-12 ENCOUNTER — Other Ambulatory Visit: Payer: Self-pay | Admitting: Gastroenterology

## 2019-06-12 DIAGNOSIS — R1011 Right upper quadrant pain: Secondary | ICD-10-CM

## 2020-10-16 ENCOUNTER — Ambulatory Visit (INDEPENDENT_AMBULATORY_CARE_PROVIDER_SITE_OTHER): Payer: BC Managed Care – PPO

## 2020-10-16 ENCOUNTER — Other Ambulatory Visit: Payer: Self-pay

## 2020-10-16 ENCOUNTER — Ambulatory Visit: Payer: BC Managed Care – PPO | Admitting: Podiatry

## 2020-10-16 DIAGNOSIS — S92811A Other fracture of right foot, initial encounter for closed fracture: Secondary | ICD-10-CM | POA: Diagnosis not present

## 2020-10-16 DIAGNOSIS — L600 Ingrowing nail: Secondary | ICD-10-CM | POA: Diagnosis not present

## 2020-10-16 DIAGNOSIS — S90211A Contusion of right great toe with damage to nail, initial encounter: Secondary | ICD-10-CM

## 2020-10-16 MED ORDER — NEOMYCIN-POLYMYXIN-HC 1 % OT SOLN: OTIC | 1 refills | Status: AC

## 2020-10-16 NOTE — Patient Instructions (Signed)

## 2020-10-16 NOTE — Progress Notes (Signed)
He presents today after jamming his second digit of his right foot.  He is also complained about ingrown toenail to the tibial border of the hallux left.  Objective: Vital signs are stable he is alert oriented x3 pulses are palpable bilateral.  He has pain on palpation of the tibial fibular sesamoid of the right hallux.  Range of motion of the first metatarsophalangeal joint is good.  Radiographs taken of the joint today do not demonstrate any type of osseous abnormalities possibly a fracture of the tibial sesamoid.  Contralateral foot demonstrates sharp incurvated nail margin along the tibial border with mild erythema no purulence no malodor but pain on palpation to the tibial border hallux left.  Assessment: Fracture tibial sesamoid right ingrown nail tibial border hallux left.  Plan: Discussed etiology pathology and surgical therapies recommended that he get back into his Darco shoe for his right foot.  Left foot we will ahead and performed chemical matricectomy today after local anesthetic was administered he tolerated procedure well after the area was prepped and draped in his normal sterile fashion.  He received both oral and written home-going instruction for the care and soaking of the foot and toe as well as a prescription for Cortisporin Otic to be applied twice daily after soaking.  I will follow-up with him in 2 weeks

## 2020-10-17 ENCOUNTER — Telehealth: Payer: Self-pay

## 2020-10-21 NOTE — Telephone Encounter (Signed)
Erroneous entry

## 2020-11-06 ENCOUNTER — Encounter: Payer: BC Managed Care – PPO | Admitting: Podiatry

## 2020-11-06 ENCOUNTER — Ambulatory Visit: Payer: BC Managed Care – PPO

## 2020-11-06 DIAGNOSIS — S92811D Other fracture of right foot, subsequent encounter for fracture with routine healing: Secondary | ICD-10-CM

## 2020-11-06 NOTE — Progress Notes (Signed)
This encounter was created in error - please disregard.

## 2020-11-22 ENCOUNTER — Emergency Department (HOSPITAL_COMMUNITY)
Admission: EM | Admit: 2020-11-22 | Discharge: 2020-11-22 | Disposition: A | Discharge status: Home / Self Care | Payer: BC Managed Care – PPO | Attending: Emergency Medicine | Admitting: Emergency Medicine

## 2020-11-22 ENCOUNTER — Encounter (HOSPITAL_COMMUNITY): Payer: Self-pay

## 2020-11-22 ENCOUNTER — Other Ambulatory Visit: Payer: Self-pay

## 2020-11-22 DIAGNOSIS — R55 Syncope and collapse: Secondary | ICD-10-CM | POA: Insufficient documentation

## 2020-11-22 DIAGNOSIS — T50901A Poisoning by unspecified drugs, medicaments and biological substances, accidental (unintentional), initial encounter: Secondary | ICD-10-CM

## 2020-11-22 DIAGNOSIS — R4182 Altered mental status, unspecified: Secondary | ICD-10-CM | POA: Diagnosis present

## 2020-11-22 LAB — ACETAMINOPHEN LEVEL: Acetaminophen (Tylenol), Serum: <10 ug/mL — ABNORMAL LOW (ref 10–30)

## 2020-11-22 LAB — CBC WITH DIFFERENTIAL/PLATELET
Abs Immature Granulocytes: 0.13 10*3/uL — ABNORMAL HIGH (ref 0.00–0.07)
Basophils Absolute: 0 10*3/uL (ref 0.0–0.1)
Basophils Relative: 0 %
Eosinophils Absolute: 0 10*3/uL (ref 0.0–0.5)
Eosinophils Relative: 0 %
HCT: 47.1 % (ref 39.0–52.0)
Hemoglobin: 16.5 g/dL (ref 13.0–17.0)
Immature Granulocytes: 1 %
Lymphocytes Relative: 5 %
Lymphs Abs: 0.9 10*3/uL (ref 0.7–4.0)
MCH: 32 pg (ref 26.0–34.0)
MCHC: 35 g/dL (ref 30.0–36.0)
MCV: 91.5 fL (ref 80.0–100.0)
Monocytes Absolute: 1.4 10*3/uL — ABNORMAL HIGH (ref 0.1–1.0)
Monocytes Relative: 7 %
Neutro Abs: 17.3 10*3/uL — ABNORMAL HIGH (ref 1.7–7.7)
Neutrophils Relative %: 87 %
Platelets: 256 10*3/uL (ref 150–400)
RBC: 5.15 MIL/uL (ref 4.22–5.81)
RDW: 12.3 % (ref 11.5–15.5)
WBC: 19.8 10*3/uL — ABNORMAL HIGH (ref 4.0–10.5)
nRBC: 0 % (ref 0.0–0.2)

## 2020-11-22 LAB — COMPREHENSIVE METABOLIC PANEL
ALT: 46 U/L — ABNORMAL HIGH (ref 0–44)
AST: 29 U/L (ref 15–41)
Albumin: 5.1 g/dL — ABNORMAL HIGH (ref 3.5–5.0)
Alkaline Phosphatase: 88 U/L (ref 38–126)
Anion gap: 16 — ABNORMAL HIGH (ref 5–15)
BUN: 10 mg/dL (ref 6–20)
CO2: 24 mmol/L (ref 22–32)
Calcium: 9.2 mg/dL (ref 8.9–10.3)
Chloride: 99 mmol/L (ref 98–111)
Creatinine, Ser: 1.48 mg/dL — ABNORMAL HIGH (ref 0.61–1.24)
GFR, Estimated: >60 mL/min (ref >60)
Glucose, Bld: 287 mg/dL — ABNORMAL HIGH (ref 70–99)
Potassium: 4.5 mmol/L (ref 3.5–5.1)
Sodium: 139 mmol/L (ref 135–145)
Total Bilirubin: 1.1 mg/dL (ref 0.3–1.2)
Total Protein: 7.8 g/dL (ref 6.5–8.1)

## 2020-11-22 LAB — CBG MONITORING, ED
Glucose-Capillary: 400 mg/dL — ABNORMAL HIGH (ref 70–99)
Glucose-Capillary: 77 mg/dL (ref 70–99)

## 2020-11-22 LAB — SALICYLATE LEVEL: Salicylate Lvl: <7 mg/dL — ABNORMAL LOW (ref 7.0–30.0)

## 2020-11-22 LAB — ETHANOL: Alcohol, Ethyl (B): <10 mg/dL (ref <10)

## 2020-11-22 MED ORDER — SODIUM CHLORIDE 0.9 % IV BOLUS
1000.0000 mL | Freq: Once | INTRAVENOUS | Status: AC
Start: 2020-11-22 — End: 2020-11-22
  Administered 2020-11-22: 1000 mL via INTRAVENOUS

## 2020-11-22 MED ORDER — ONDANSETRON HCL 4 MG/2ML IJ SOLN
4.0000 mg | Freq: Once | INTRAMUSCULAR | Status: DC
  Filled 2020-11-22: qty 2

## 2020-11-22 NOTE — Discharge Instructions (Addendum)
Return to the emergency department if you develop any new and/or concerning symptoms. °

## 2020-11-22 NOTE — ED Triage Notes (Signed)
Patient BIB [facility], found unresponsive in his truck with decreased respirations, patient given 1 mg Narcan intranasally, EMS noted pinpoint pupils, patient assisted with ventilation by EMS, initially GCS 3 for EMS, now GCS 15. Initial CBG read high and then 500.

## 2020-11-22 NOTE — ED Provider Notes (Addendum)
[facility] EMERGENCY DEPARTMENT Provider Note   CSN: [id number] Arrival date & time: 11/22/20  0103     History Chief Complaint  Patient presents with  . Hyperglycemia  . Altered Mental Status    KASSON LAMERE is a 20 y.o. male.  Patient is a 20 year old male with no significant past medical history.  Patient brought by EMS for evaluation of altered mental status.  From what I am told, patient was found unresponsive in a parking lot behind the wheel of his car.  Police were summoned where patient was unarousable.  He was then given intranasal Narcan, then seemed to wake up several minutes later.  He was apparently bagged on scene by EMS, then transported here.  Denies to me having taken any drugs or alcohol.  He has no specific complaints other than that he has been depressed lately.  He denies suicidal or homicidal ideation.  The history is provided by the patient.  Altered Mental Status Presenting symptoms: unresponsiveness   Severity:  Moderate Most recent episode:  Today Episode history:  Single Timing:  Constant Progression:  Resolved Chronicity:  New Context: drug use        History reviewed. No pertinent past medical history.  Patient Active Problem List   Diagnosis Date Noted  . Amblyopia 03/23/2018  . 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 History of strabismus surgery 06/23/2017  . 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 No pertinent surgical history.     History reviewed. No pertinent family history.  Social History   Tobacco Use  . Smoking status: Unknown If Ever Smoked  . Smokeless tobacco: Never Used    Home Medications Prior to Admission medications   Medication Sig Start Date End Date Taking? Authorizing Provider  busPIRone (BUSPAR) 15 MG tablet buspirone 15 mg tablet    [provider]  EPINEPHrine 0.3 mg/0.3 mL IJ  SOAJ injection Use as needed. 03/31/18   [provider]  NEOMYCIN-POLYMYXIN-HYDROCORTISONE (CORTISPORIN) 1 % SOLN OTIC solution Apply 1-2 drops to toe BID after soaking 10/16/20   Hyatt, Max T, DPM    Allergies    Bee pollen, Percocet [oxycodone-acetaminophen], and Tramadol  Review of Systems   Review of Systems  All other systems reviewed and are negative.   Physical Exam Updated Vital Signs BP (!) 150/93   Pulse (!) 125   Temp (!) 97.5 F (36.4 C) (Axillary)   Resp 20   Ht 6\' 2"  (1.88 m)   Wt 65.8 kg   SpO2 97%   BMI 18.62 kg/m   Physical Exam Vitals and nursing note reviewed.  Constitutional:      General: He is not in acute distress.    Appearance: He is well-developed and well-nourished. He is not diaphoretic.  HENT:     Head: Normocephalic and atraumatic.     Mouth/Throat:     Mouth: Oropharynx is clear and moist.  Eyes:     Extraocular Movements: Extraocular movements intact.     Pupils: Pupils are equal, round, and reactive to light.  Cardiovascular:     Rate and Rhythm: Normal rate and regular rhythm.     Heart sounds: No murmur heard. No friction rub.  Pulmonary:     Effort: Pulmonary effort is normal. No respiratory distress.     Breath sounds: Normal breath sounds. No wheezing or rales.  Abdominal:  General: Bowel sounds are normal. There is no distension.     Palpations: Abdomen is soft.     Tenderness: There is no abdominal tenderness.  Musculoskeletal:        General: No edema. Normal range of motion.     Cervical back: Normal range of motion and neck supple.  Skin:    General: Skin is warm and dry.  Neurological:     General: No focal deficit present.     Mental Status: He is alert and oriented to person, place, and time.     Cranial Nerves: No cranial nerve deficit.     Motor: No weakness.     Coordination: Coordination normal.     ED Results / Procedures / Treatments   Labs (all labs ordered are listed, but only abnormal  results are displayed) Labs Reviewed  COMPREHENSIVE METABOLIC PANEL  CBC WITH DIFFERENTIAL/PLATELET  ETHANOL  ACETAMINOPHEN LEVEL  SALICYLATE LEVEL  URINALYSIS, ROUTINE W REFLEX MICROSCOPIC  RAPID URINE DRUG SCREEN, HOSP PERFORMED    EKG None  Radiology No results found.  Procedures Procedures   Medications Ordered in ED Medications  sodium chloride 0.9 % bolus 1,000 mL (has no administration in time range)    ED Course  I have reviewed the triage vital signs and the nursing notes.  Pertinent labs & imaging results that were available during my care of the patient were reviewed by me and considered in my medical decision making (see chart for details).    MDM Rules/Calculators/A&P  Patient brought by EMS after being found unresponsive behind the wheel of his car.  Patient given Narcan, then regained consciousness.  He had a brief period of bagging in route.  Patient's laboratory studies essentially unremarkable.  His initial blood sugar was 287, however after receiving fluids it is now 77.  I suspect the elevated glucose was a stress response as he has no history of diabetes.  Patient is now requesting to go home.  He has been observed in the ER for several hours and is now wide-awake and seems appropriate for discharge.  Patient also reports being depressed, however denies that this was an attempt to harm himself.  He denies suicidal ideation.  Final Clinical Impression(s) / ED Diagnoses Final diagnoses:  None    Rx / DC Orders ED Discharge Orders    None       Geoffery Lyons, MD 11/22/20 2505    Geoffery Lyons, MD 11/22/20 [id number]

## 2022-06-02 ENCOUNTER — Other Ambulatory Visit: Payer: Self-pay | Admitting: Physician Assistant

## 2022-06-02 DIAGNOSIS — R42 Dizziness and giddiness: Secondary | ICD-10-CM

## 2022-06-02 DIAGNOSIS — G44311 Acute post-traumatic headache, intractable: Secondary | ICD-10-CM

## 2022-06-03 ENCOUNTER — Ambulatory Visit
Admission: RE | Admit: 2022-06-03 | Discharge: 2022-06-03 | Disposition: A | Discharge status: Home / Self Care | Payer: BC Managed Care – PPO | Source: Ambulatory Visit | Attending: Physician Assistant | Admitting: Physician Assistant

## 2022-06-03 DIAGNOSIS — G44311 Acute post-traumatic headache, intractable: Secondary | ICD-10-CM | POA: Insufficient documentation

## 2022-06-03 DIAGNOSIS — R42 Dizziness and giddiness: Secondary | ICD-10-CM | POA: Diagnosis present

## 2022-11-16 ENCOUNTER — Ambulatory Visit
Admission: EM | Admit: 2022-11-16 | Discharge: 2022-11-16 | Disposition: A | Discharge status: Home / Self Care | Payer: BC Managed Care – PPO

## 2022-11-16 DIAGNOSIS — J019 Acute sinusitis, unspecified: Secondary | ICD-10-CM | POA: Diagnosis not present

## 2022-11-16 DIAGNOSIS — R062 Wheezing: Secondary | ICD-10-CM

## 2022-11-16 DIAGNOSIS — B9689 Other specified bacterial agents as the cause of diseases classified elsewhere: Secondary | ICD-10-CM

## 2022-11-16 MED ORDER — AMOXICILLIN-POT CLAVULANATE 875-125 MG PO TABS: 1.0000 | ORAL_TABLET | Freq: Two times a day (BID) | ORAL | 0 refills | Status: AC

## 2022-11-16 MED ORDER — AEROCHAMBER PLUS FLO-VU MEDIUM MISC: 1.0000 | Freq: Once | Status: DC

## 2022-11-16 NOTE — ED Triage Notes (Signed)
[facility] with [facility] of ha and sinus pressure , and decreased hearing for 4 days. Pt reports he was seen at [facility] on Thursday and was given an inhaler and prednisone pt reports no improvement with these medications

## 2022-11-16 NOTE — Discharge Instructions (Signed)
Use the albuterol inhaler with a spacer 2 puffs every 4-6 hours if needed for wheezing or shortness of breath.  Because you cannot tell if you are wheezing right now, I would use the inhaler at least 3 times a day for the next few days.  Call Ms. McLaughlin's office and get an appointment to see her in the next week; I want her to recheck your breathing since you have been having wheezing and you do not have a history of asthma.  Take Mucinex as instructed on the package to help your congestion thin out and drain.  I strongly recommend using saline spray in your nose or saline irrigation for your nose.   If you are willing to try it, try using saline irrigation, such as with a neti pot, several times a day while you are sick. Many neti pots come with salt packets premeasured to use to make saline. If you use your own salt, make sure it is kosher salt or sea salt (don't use table salt as it has iodine in it and you don't need that in your nose). Use distilled water to make saline. If you mix your own saline using your own salt, the recipe is 1/4 teaspoon salt in 1 cup warm water. Using saline irrigation can help prevent and treat sinus infections.

## 2022-11-16 NOTE — ED Provider Notes (Signed)
Alec Perez    CSN: [id number] Arrival date & time: 11/16/22  1324      History   Chief Complaint Chief Complaint  Patient presents with   sinus pressure    Headache    HPI RACE [name] is a 22 y.o. male. Pt presents to UC with c/o of HA and sinus pressure, and decreased hearing for 4 days. Pt reports he was seen at an outside UC on 11/12/22 and was given an inhaler and prednisone pt reports no improvement with these medications. Did not have much nasal congestion or drainage 11/12/22 but has a lot now. C/o sinus pressure, teeth hurt, and pressure behind his eyes B.  Other than taking prednisone and using the albuterol inhaler maybe once a day, he is not taking any medications or doing anything to help his symptoms.  Reports nasal drainage is thick and dark green.   Headache   No past medical history on file.  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     Home Medications    Prior to Admission medications   Medication Sig Start Date End Date Taking? Authorizing Provider  albuterol (VENTOLIN HFA) 108 (90 Base) MCG/ACT inhaler Inhale 2 puffs into the lungs every 4 (four) hours as needed. 11/11/22  Yes [provider]  amoxicillin-clavulanate (AUGMENTIN) 875-125 MG tablet Take 1 tablet by mouth 2 (two) times daily. 11/16/22  Yes Cathlyn Parsons, NP  busPIRone (BUSPAR) 15 MG tablet buspirone 15 mg tablet    [provider]  EPINEPHrine 0.3 mg/0.3 mL IJ SOAJ injection Use as needed. 03/31/18   [provider]  NEOMYCIN-POLYMYXIN-HYDROCORTISONE (CORTISPORIN) 1 % SOLN OTIC solution Apply 1-2 drops to toe BID after soaking 10/16/20   Hyatt, Max T, DPM  predniSONE (DELTASONE) 20 MG  tablet Take 40 mg by mouth daily.    [provider]    Family History No family history on file.  Social History Social History   Tobacco Use   Smoking status: Unknown   Smokeless tobacco: Never     Allergies   Bee pollen, Percocet [oxycodone-acetaminophen], and Tramadol   Review of Systems Review of Systems  Neurological:  Positive for headaches.     Physical Exam Triage Vital Signs ED Triage Vitals  Enc Vitals Group     BP 11/16/22 1458 135/77     Pulse Rate 11/16/22 1458 (!) 52     Resp 11/16/22 1458 17     Temp 11/16/22 1458 98.2 F (36.8 C)     Temp Source 11/16/22 1458 Oral     SpO2 11/16/22 1458 97 %     Weight --      Height --      Head Circumference --      Peak Flow --      Pain Score 11/16/22 1456 4     Pain Loc --      Pain Edu? --      Excl. in GC? --    No data found.  Updated Vital Signs BP 135/77   Pulse (!) 52   Temp 98.2 F (36.8 C) (Oral)   Resp 17   SpO2 97%   Visual Acuity Right Eye Distance:  Left Eye Distance:   Bilateral Distance:    Right Eye Near:   Left Eye Near:    Bilateral Near:     Physical Exam Constitutional:      General: He is not in acute distress.    Appearance: He is well-developed. He is ill-appearing.  HENT:     Right Ear: Tympanic membrane, ear canal and external ear normal.     Left Ear: Tympanic membrane, ear canal and external ear normal.     Nose: Congestion present.     Right Sinus: Maxillary sinus tenderness and frontal sinus tenderness present.     Left Sinus: Maxillary sinus tenderness and frontal sinus tenderness present.     Mouth/Throat:     Mouth: Mucous membranes are moist.     Pharynx: Oropharynx is clear. No oropharyngeal exudate or posterior oropharyngeal erythema.  Cardiovascular:     Rate and Rhythm: Normal rate and regular rhythm.  Pulmonary:     Effort: Pulmonary effort is normal.     Breath sounds: Wheezing present.     Comments: Diffuse mild  wheezing Neurological:     Mental Status: He is alert.      UC Treatments / Results  Labs (all labs ordered are listed, but only abnormal results are displayed) Labs Reviewed - No data to display  EKG   Radiology No results found.  Procedures Procedures (including critical care time)  Medications Ordered in UC Medications  AeroChamber Plus Flo-Vu Medium MISC 1 each (has no administration in time range)    Initial Impression / Assessment and Plan / UC Course  I have reviewed the triage vital signs and the nursing notes.  Pertinent labs & imaging results that were available during my care of the patient were reviewed by me and considered in my medical decision making (see chart for details).    Patient given a spacer here in urgent care to use with his albuterol inhaler from an outside urgent care last week.  Reviewed use with patient.  Lungs clear to auscultation after use of albuterol inhaler with spacer.  Recommended he follow-up with his PCP in the next week to have his breathing rechecked.  He does not have a history of asthma.  Will treat for bacterial sinus infection.  Final Clinical Impressions(s) / UC Diagnoses   Final diagnoses:  Wheezing  Acute bacterial sinusitis     Discharge Instructions      Use the albuterol inhaler with a spacer 2 puffs every 4-6 hours if needed for wheezing or shortness of breath.  Because you cannot tell if you are wheezing right now, I would use the inhaler at least 3 times a day for the next few days.  Call Ms. McLaughlin's office and get an appointment to see her in the next week; I want her to recheck your breathing since you have been having wheezing and you do not have a history of asthma.  Take Mucinex as instructed on the package to help your congestion thin out and drain.  I strongly recommend using saline spray in your nose or saline irrigation for your nose.   If you are willing to try it, try using saline irrigation,  such as with a neti pot, several times a day while you are sick. Many neti pots come with salt packets premeasured to use to make saline. If you use your own salt, make sure it is kosher salt or sea salt (don't use table salt as it has iodine in it  and you don't need that in your nose). Use distilled water to make saline. If you mix your own saline using your own salt, the recipe is 1/4 teaspoon salt in 1 cup warm water. Using saline irrigation can help prevent and treat sinus infections.    ED Prescriptions     Medication Sig Dispense Auth. Provider   amoxicillin-clavulanate (AUGMENTIN) 875-125 MG tablet Take 1 tablet by mouth 2 (two) times daily. 14 tablet Carvel Getting, NP      PDMP not reviewed this encounter.   Carvel Getting, NP 11/16/22 [phone]
